# Patient Record
Sex: Female | Born: 2000 | Race: White | Hispanic: No | Marital: Single
Health system: Southern US, Community
[De-identification: ages and names within clinical notes are randomized; demographics above are authoritative.]

## PROBLEM LIST (undated history)

## (undated) DIAGNOSIS — E669 Obesity, unspecified: Secondary | ICD-10-CM

## (undated) HISTORY — DX: Obesity, unspecified: E66.9

---
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
## 2023-12-03 ENCOUNTER — Ambulatory Visit
Admission: EM | Admit: 2023-12-03 | Discharge: 2023-12-03 | Disposition: A | Discharge status: Home / Self Care | Payer: Medicaid Other | Attending: Nurse Practitioner | Admitting: Nurse Practitioner

## 2023-12-03 DIAGNOSIS — M7661 Achilles tendinitis, right leg: Secondary | ICD-10-CM | POA: Diagnosis not present

## 2023-12-03 MED ORDER — PREDNISONE 20 MG PO TABS: 40.0000 mg | ORAL_TABLET | Freq: Every day | ORAL | 0 refills | Status: AC

## 2023-12-03 NOTE — Discharge Instructions (Addendum)
Take medication as prescribed. May take Tylenol extra strength as needed for pain or discomfort.  Make sure you are taking Tylenol while taking the prednisone.  Once you complete the prednisone, you can begin taking NSAIDs such as ibuprofen, Aleve, Advil, or naproxen. Gentle range of motion exercises to help improve joint mobility. RICE therapy, rest, ice, compression, and elevation.  An ankle brace has been provided to allow for additional compression and support.  Apply ice for 20 minutes, remove for 1 hour, repeat is much as possible while symptoms persist. Try to wear shoes with good insole and support, such as hightop sneakers.   As discussed, if symptoms fail to improve with this treatment, recommend following up with orthopedics for further evaluation. Follow-up as needed.

## 2023-12-03 NOTE — ED Provider Notes (Signed)
[facility]    CSN: [id number] Arrival date & time: 12/03/23  9629      History   Chief Complaint No chief complaint on file.   HPI Andrea Weeks is a 23 y.o. female.   The history is provided by the patient.   Patient presents for complaints of right ankle pain that started over the past 3 days.  Patient states pain is in the back of her ankle.  She reports that she has pain when she is walking flat-footed and when she is trying to flex and extend the foot.  Patient does not recall any specific injury or trauma, she denies inability to ambulate, numbness, tingling, redness, swelling, bruising, or radiation of pain.  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  She states that she wears tennis shoes similar to "vans or [place]" while she is at work.  Past Medical History:  Diagnosis Date   Obesity, unspecified 10/25/2013    Patient Active Problem List   Diagnosis Date Noted   Obesity, unspecified 10/25/2013    History reviewed. No pertinent surgical history.  OB History   No obstetric history on file.      Home Medications    Prior to Admission medications   Medication Sig Start Date End Date Taking? Authorizing Provider  antipyrine-benzocaine Lyla Son) otic solution  05/23/14   [provider]  loratadine (CLARITIN) 5 MG chewable tablet Chew 2 tablets (10 mg total) by mouth daily. 10/25/13   Laurell Josephs, MD  montelukast (SINGULAIR) 5 MG chewable tablet Chew 1 tablet (5 mg total) by mouth at bedtime. Meets PA Criteria 10/25/13   Laurell Josephs, MD  neomycin-polymyxin-hydrocortisone (CORTISPORIN) 3.5-10000-1 otic suspension  05/23/14   [provider]  amphetamine-dextroamphetamine (ADDERALL XR) 20 MG 24 hr capsule Take 1 capsule (20 mg total) by mouth daily. 11/20/14 07/19/20  Arnaldo Natal, MD    Family History History reviewed. No pertinent family history.  Social History Social History   Tobacco Use    Smoking status: Passive Smoke Exposure - Never Smoker  Substance Use Topics   Alcohol use: Never   Drug use: Never     Allergies   Patient has no known allergies.   Review of Systems Review of Systems Per HPI  Physical Exam Triage Vital Signs ED Triage Vitals  Encounter Vitals Group     BP 12/03/23 1028 109/66     Systolic BP Percentile --      Diastolic BP Percentile --      Pulse Rate 12/03/23 1028 74     Resp 12/03/23 1028 15     Temp 12/03/23 1028 98.2 F (36.8 C)     Temp Source 12/03/23 1028 Oral     SpO2 12/03/23 1028 96 %     Weight --      Height --      Head Circumference --      Peak Flow --      Pain Score 12/03/23 1030 4     Pain Loc --      Pain Education --      Exclude from Growth Chart --    No data found.  Updated Vital Signs BP 109/66 (BP Location: Right Arm)   Pulse 74   Temp 98.2 F (36.8 C) (Oral)   Resp 15   SpO2 96%   Visual Acuity Right Eye Distance:   Left Eye Distance:   Bilateral Distance:  Right Eye Near:   Left Eye Near:    Bilateral Near:     Physical Exam Vitals and nursing note reviewed.  Constitutional:      General: She is not in acute distress.    Appearance: Normal appearance.  HENT:     Head: Normocephalic.  Eyes:     Extraocular Movements: Extraocular movements intact.     Pupils: Pupils are equal, round, and reactive to light.  Musculoskeletal:     Cervical back: Normal range of motion.     Right ankle: No swelling, deformity or ecchymosis. Tenderness present. Decreased range of motion (pain with flexion and extension). Normal pulse.     Right foot: Normal.     Comments: Tenderness noted to right achilles tendonitis  Skin:    General: Skin is warm and dry.  Neurological:     General: No focal deficit present.     Mental Status: She is alert and oriented to person, place, and time.  Psychiatric:        Mood and Affect: Mood normal.        Behavior: Behavior normal.      UC Treatments /  Results  Labs (all labs ordered are listed, but only abnormal results are displayed) Labs Reviewed - No data to display  EKG   Radiology No results found.  Procedures Procedures (including critical care time)  Medications Ordered in UC Medications - No data to display  Initial Impression / Assessment and Plan / UC Course  I have reviewed the triage vital signs and the nursing notes.  Pertinent labs & imaging results that were available during my care of the patient were reviewed by me and considered in my medical decision making (see chart for details).  Suspect Achilles tendinitis.  Will treat with prednisone 40 mg for the next 5 days.  Ankle brace was provided to allow for additional compression and support.  Supportive care recommendations were provided and discussed with the patient to include over-the-counter Tylenol, RICE therapy, range of motion exercises, and wearing shoes with good insole and support such as hightop.  Patient was given indication regarding follow-up.  Patient was in agreement with this plan of care and verbalizes understanding.  All questions were answered.  Patient stable for discharge.  Work note was provided.  Final Clinical Impressions(s) / UC Diagnoses   Final diagnoses:  None   Discharge Instructions   None    ED Prescriptions   None    PDMP not reviewed this encounter.   Abran Cantor, NP 12/03/23 1048

## 2023-12-03 NOTE — ED Triage Notes (Signed)
Pt reports right ankle pain, x 2 days. denies injury. 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
